# Patient Record
Sex: Female | Born: 1965 | Hispanic: Yes | Marital: Married | Smoking: Never smoker
Health system: Southern US, Community
[De-identification: ages and names within clinical notes are randomized; demographics above are authoritative.]

## PROBLEM LIST (undated history)

## (undated) HISTORY — PX: NECK SURGERY: SHX720

---

## 2016-05-03 ENCOUNTER — Ambulatory Visit (INDEPENDENT_AMBULATORY_CARE_PROVIDER_SITE_OTHER): Payer: Worker's Compensation | Admitting: Family Medicine

## 2016-05-03 ENCOUNTER — Encounter: Payer: Self-pay | Admitting: Family Medicine

## 2016-05-03 ENCOUNTER — Ambulatory Visit: Payer: Worker's Compensation

## 2016-05-03 VITALS — BP 118/74 | HR 75 | Temp 98.1°F | Resp 16 | Ht 60.0 in | Wt 151.0 lb

## 2016-05-03 DIAGNOSIS — S46912A Strain of unspecified muscle, fascia and tendon at shoulder and upper arm level, left arm, initial encounter: Secondary | ICD-10-CM

## 2016-05-03 DIAGNOSIS — M25512 Pain in left shoulder: Secondary | ICD-10-CM

## 2016-05-03 DIAGNOSIS — Z0289 Encounter for other administrative examinations: Secondary | ICD-10-CM

## 2016-05-03 MED ORDER — IBUPROFEN 600 MG PO TABS: 600.0000 mg | ORAL_TABLET | Freq: Three times a day (TID) | ORAL | 0 refills | Status: DC | PRN

## 2016-05-03 NOTE — Progress Notes (Signed)
Emma Lawrence is a 50 y.o. female who presents to Urgent Care today for shoulder pain for work-related injury which occurred 8/18:  1.  Shoulder pain:  She was sent from work. She works at [employer].  She works mostly Youth workermanual labor, working to [employer].  She has to carry racks of meat, put them in the oven, and then carry them to racks to cool off.  She the separates that into smaller pieces.    The pain started on Friday.  She was pulling grills filled with chicken from above into a plastic container.  This is a two person job, and the other person let her side go.  She felt a sudden pain in her shoulder when the other person dropped their side.  The motion was lifting the container from the ground.  Her arms were extended and she was trying to lift her side.  These containers are around 60 lbs.    She had some pain over the weekend and was taking Ibuprofen Saturday and Sunday with some relief of her pain.  She returned work on Monday, and Was not hurting Monday.  Pain returned on Tuesday. Hasn't taken Ibuprofen.  Pain has persisted since Tuesday  The pain radiates from above her shoulder to above the elbow and states it feels hot.    No deformity to shoulder.   ROS as above.    PMH reviewed. Patient is a nonsmoker.   No past medical history on file. No past surgical history on file.  Medications reviewed. No current outpatient prescriptions on file.   No current facility-administered medications for this visit.     Physical Exam:  BP 118/74 (BP Location: Right Arm, Patient Position: Sitting, Cuff Size: Normal)   Pulse 75   Temp 98.1 F (36.7 C)   Resp 16   Ht 5' (1.524 m)   Wt 151 lb (68.5 kg)   SpO2 98%   BMI 29.49 kg/m  Gen:  Alert, cooperative patient who appears stated age in no acute distress.  Vital signs reviewed. MSK. Left Shoulder Exam:  Appearance: Normal Pain on palpation: normal, some mild deltoid atrophy ROM: Abduction good, though pain beyond 90 degrees  FF 180  degrees  Int good  Ext diminished by pain  Strength: SS 5/5 Int 5/5  Ext 4/5 Neer's and empty can positive.  Otherwise shoulder testing negative.   [facility] reading (PRIMARY) by  Dr. Gwendolyn GrantWalden:  No signs of acute bony abnormality. No shoulder dislocation.   Assessment and Plan:  1.  Left shoulder pain: - think she has had muscle strain, likely rotator cuff strain.  Mild. - However, exacerbated by heavy lfiting - she can return to work with restrictions of not lifting anything above 10 lbs.   - Ibuprofen 600 mg, ice, rotational exercises for rehabilitation.  - FU with us on Tuesday for re-examination. She can get off work early that day.   [employer] interpreter Myrlene Brokerlejandra [phone] used for entire visit

## 2016-05-03 NOTE — Patient Instructions (Addendum)
You should come back and see us on Tuesday to be seen.    Take the Ibuprofen for pain relief every 6 - 8 hours as needed.  You can usKoreae ice as needed.  Do the shoulder rotations as we discussed.      IF you received an x-ray today, you will receive an invoice from [facility]. Please contact [facility] at [phone] with questions or concerns regarding your invoice.   IF you received labwork today, you will receive an invoice from [facility]. Please contact [facility] at [phone] with questions or concerns regarding your invoice.   Our billing staff will not be able to assist you with questions regarding bills from these companies.  You will be contacted with the lab results as soon as they are available. The fastest way to get your results is to activate your My Chart account. Instructions are located on the last page of this paperwork. If you have not heard from us regarding the results in 2 weeks, please contact this office.

## 2016-05-08 ENCOUNTER — Ambulatory Visit (INDEPENDENT_AMBULATORY_CARE_PROVIDER_SITE_OTHER): Payer: Worker's Compensation | Admitting: Physician Assistant

## 2016-05-08 VITALS — BP 116/78 | HR 71 | Temp 97.7°F | Resp 17 | Ht 60.0 in | Wt 149.0 lb

## 2016-05-08 DIAGNOSIS — M25512 Pain in left shoulder: Secondary | ICD-10-CM | POA: Diagnosis not present

## 2016-05-08 MED ORDER — NAPROXEN 500 MG PO TABS: 500.0000 mg | ORAL_TABLET | Freq: Two times a day (BID) | ORAL | 0 refills | Status: AC

## 2016-05-08 NOTE — Progress Notes (Signed)
    MRN: [id number] DOB: [birth year]  Subjective:   Emma Lawrence is a 50 y.o. female presenting for chief complaint of Follow-up (w/c injury)  Pt initially seen on 05/03/16. Since previous visit, she has avoided lifting heavy things at work. She still has associated burning pain in left shoulder extending down upper arm if she carries anything over the weight of a gallon of a milk or if she lifts left arm over her head. Has only tried ibuprofen and ice to affected area twice since her last visit and it provided relief. She does admit to doing the shoulder exercises every day since her last visit. Denies numbness, tingling, loss of sensation, and elbow/wrist pain.   [phone] Emma Lawrence was interpreter for visit.  Emma Lawrence's medications list, allergies, past medical history and past surgical history were reviewed and excluded from this note due to being a worker's comp case.  Objective:   Vitals: BP 116/78 (BP Location: Right Arm, Patient Position: Sitting, Cuff Size: Normal)   Pulse 71   Temp 97.7 F (36.5 C) (Oral)   Resp 17   Ht 5' (1.524 m)   Wt 149 lb (67.6 kg)   LMP  (LMP Unknown)   SpO2 100%   BMI 29.10 kg/m   Physical Exam  Constitutional: She is oriented to person, place, and time. She appears well-developed and well-nourished.  HENT:  Head: Normocephalic and atraumatic.  Eyes: Conjunctivae are normal.  Neck: Normal range of motion.  Pulmonary/Chest: Effort normal.  Musculoskeletal:       Right shoulder: Normal.       Left shoulder: She exhibits tenderness ( along palpation of medial deltoid ) and pain ( with internal rotation ). She exhibits no bony tenderness and no swelling.       Right elbow: Normal.      Left elbow: Normal.       Right wrist: Normal.       Left wrist: Normal.  Positive Neer's Sign Negative Drop Arm Sign  Pain with abduction of left arm     Neurological: She is alert and oriented to person, place, and time. She has normal strength. No sensory deficit.    Reflex Scores:      Tricep reflexes are 2+ on the right side and 2+ on the left side.      Bicep reflexes are 2+ on the right side and 2+ on the left side.      Brachioradialis reflexes are 2+ on the right side and 2+ on the left side. Skin: Skin is warm and dry.  Psychiatric: She has a normal mood and affect.  Vitals reviewed.  No results found for this or any previous visit (from the past 24 hour(s)).  Assessment and Plan :  1. Left shoulder pain -Likely muscle strain with possible radicular pain. Since pt has not tried consistent conservative therapy, will try this for one week and have pt follow up if no improvement -Ice to affected area 4-5 x day for 30 min at a time - naproxen (NAPROSYN) 500 MG tablet; Take 1 tablet (500 mg total) by mouth 2 (two) times daily with a meal.  Dispense: 30 tablet; Refill: 0 -Avoid overhead activities and lifting/pulling/pushing >10lbs. -Pt to return in one week for further evaluation. If no improvement in pain at this time, consider additional films of c-spine and/or referral to orthopedics or physical therapy.  Emma CoreBrittany Gina Costilla, PA-C  [facility] 05/08/2016 6:06 PM

## 2016-05-08 NOTE — Patient Instructions (Addendum)
Tome naproxen dos veces al da diariamente durante la semana siguiente para la inflamacin., Use hielo a la zona afectada 4-5 veces al da durante 30 minutos a la vez cada da para la inflamacin. No realice ninguna actividad area ni levante en el trabajo. Seguimiento en 7 das para su reexamen.   IF you received an x-ray today, you will receive an invoice from [facility]. Please contact [facility] at [phone] with questions or concerns regarding your invoice.   IF you received labwork today, you will receive an invoice from [facility]. Please contact [facility] at [phone] with questions or concerns regarding your invoice.   Our billing staff will not be able to assist you with questions regarding bills from these companies.  You will be contacted with the lab results as soon as they are available. The fastest way to get your results is to activate your My Chart account. Instructions are located on the last page of this paperwork. If you have not heard from us regarding the results in 2 weeks, please contact this office.

## 2016-08-28 ENCOUNTER — Other Ambulatory Visit: Payer: Self-pay | Admitting: Obstetrics and Gynecology

## 2016-08-28 DIAGNOSIS — Z1231 Encounter for screening mammogram for malignant neoplasm of breast: Secondary | ICD-10-CM

## 2016-09-27 ENCOUNTER — Ambulatory Visit (HOSPITAL_COMMUNITY): Payer: Self-pay

## 2016-11-01 ENCOUNTER — Encounter (HOSPITAL_COMMUNITY): Payer: Self-pay

## 2016-11-01 ENCOUNTER — Ambulatory Visit (HOSPITAL_COMMUNITY)
Admission: RE | Admit: 2016-11-01 | Discharge: 2016-11-01 | Disposition: A | Discharge status: Home / Self Care | Payer: Self-pay | Source: Ambulatory Visit | Attending: Obstetrics and Gynecology | Admitting: Obstetrics and Gynecology

## 2016-11-01 ENCOUNTER — Ambulatory Visit
Admission: RE | Admit: 2016-11-01 | Discharge: 2016-11-01 | Disposition: A | Discharge status: Home / Self Care | Payer: No Typology Code available for payment source | Source: Ambulatory Visit | Attending: Obstetrics and Gynecology | Admitting: Obstetrics and Gynecology

## 2016-11-01 VITALS — BP 108/62 | Temp 98.1°F | Ht 60.0 in | Wt 163.8 lb

## 2016-11-01 DIAGNOSIS — Z01419 Encounter for gynecological examination (general) (routine) without abnormal findings: Secondary | ICD-10-CM

## 2016-11-01 DIAGNOSIS — Z1231 Encounter for screening mammogram for malignant neoplasm of breast: Secondary | ICD-10-CM

## 2016-11-01 NOTE — Progress Notes (Signed)
No complaints today.   Pap Smear: Pap smear completed today. [place] Pap [place] was [place] years ago in [place] and normal per patient. Per patient has no history of an abnormal Pap smear. No Pap smear results are in EPIC.  Physical exam: Breasts Breasts symmetrical. No skin abnormalities bilateral breasts. No nipple retraction bilateral breasts. No nipple discharge bilateral breasts. No lymphadenopathy. No lumps palpated bilateral breasts. No complaints of pain or tenderness on exam. [facility]. Appointment scheduled for Thursday, November 01, 2016 at 1330.  Pelvic/Bimanual   Ext Genitalia No lesions, no swelling and no discharge observed on external genitalia.         Vagina Vagina pink and normal texture. No lesions or discharge observed in vagina.          Cervix Cervix is present. Cervix pink and of normal texture. Cervix friable. No discharge observed.     Uterus Uterus is present and palpable. Uterus in normal position and normal size.        Adnexae Bilateral ovaries present and palpable. No tenderness on palpation.          Rectovaginal No rectal exam completed today since patient had no rectal complaints. No skin abnormalities observed on exam.    Smoking History: Patient has never smoked.  Patient Navigation: Patient education provided. [facility] to services provided for [facility] through [facility]. Spanish interpreter provided.   Used Spanish interpreter [employer] Mora from [employer].

## 2016-11-01 NOTE — Progress Notes (Signed)
Pap smear completed

## 2016-11-01 NOTE — Patient Instructions (Signed)
Explained breast self awareness with Jeanett SchleinAlma Overfield. Let patient know [facility] will cover Pap smears and HPV typing every 5 years unless has a history of abnormal Pap smears. [facility] for [facility] mamm[facility]. Appointment scheduled for Thursday, November 01, 2016 at 1330. Let patient know will follow up with her within the next couple weeks with results of Pap smear by phone. Informed patient that the Breast Center will follow up with her within the next couple of weeks with results of mammogram by letter or phone. Jeanett SchleinAlma Jarnigan verbalized understanding.  Jayron Maqueda, Kathaleen Maserhristine Poll, RN 2:42 PM

## 2016-11-02 ENCOUNTER — Encounter (HOSPITAL_COMMUNITY): Payer: Self-pay | Admitting: *Deleted

## 2016-11-02 LAB — CYTOLOGY - PAP
Diagnosis: NEGATIVE
HPV (WINDOPATH): NOT DETECTED

## 2016-11-14 ENCOUNTER — Telehealth (HOSPITAL_COMMUNITY): Payer: Self-pay | Admitting: *Deleted

## 2016-11-14 NOTE — Telephone Encounter (Signed)
Patient returned called and advised patient of negative pap smear results. HPV was negative. Next pap smear due in five years. Patient voiced understanding. Used interpreter Delorise RoyalsJulie Sowell.

## 2016-11-14 NOTE — Telephone Encounter (Signed)
Telephoned patient at home number and left message to return call to [facility]. Used interpreter Julie Sowell.  

## 2018-01-19 IMAGING — DX DG SHOULDER 2+V*L*
3 series · 3 of 3 positions shown · non-contrast
Comparison: None.

CLINICAL DATA: Pain without trauma

EXAM:
LEFT SHOULDER - 2+ VIEW

[shoulder ap]
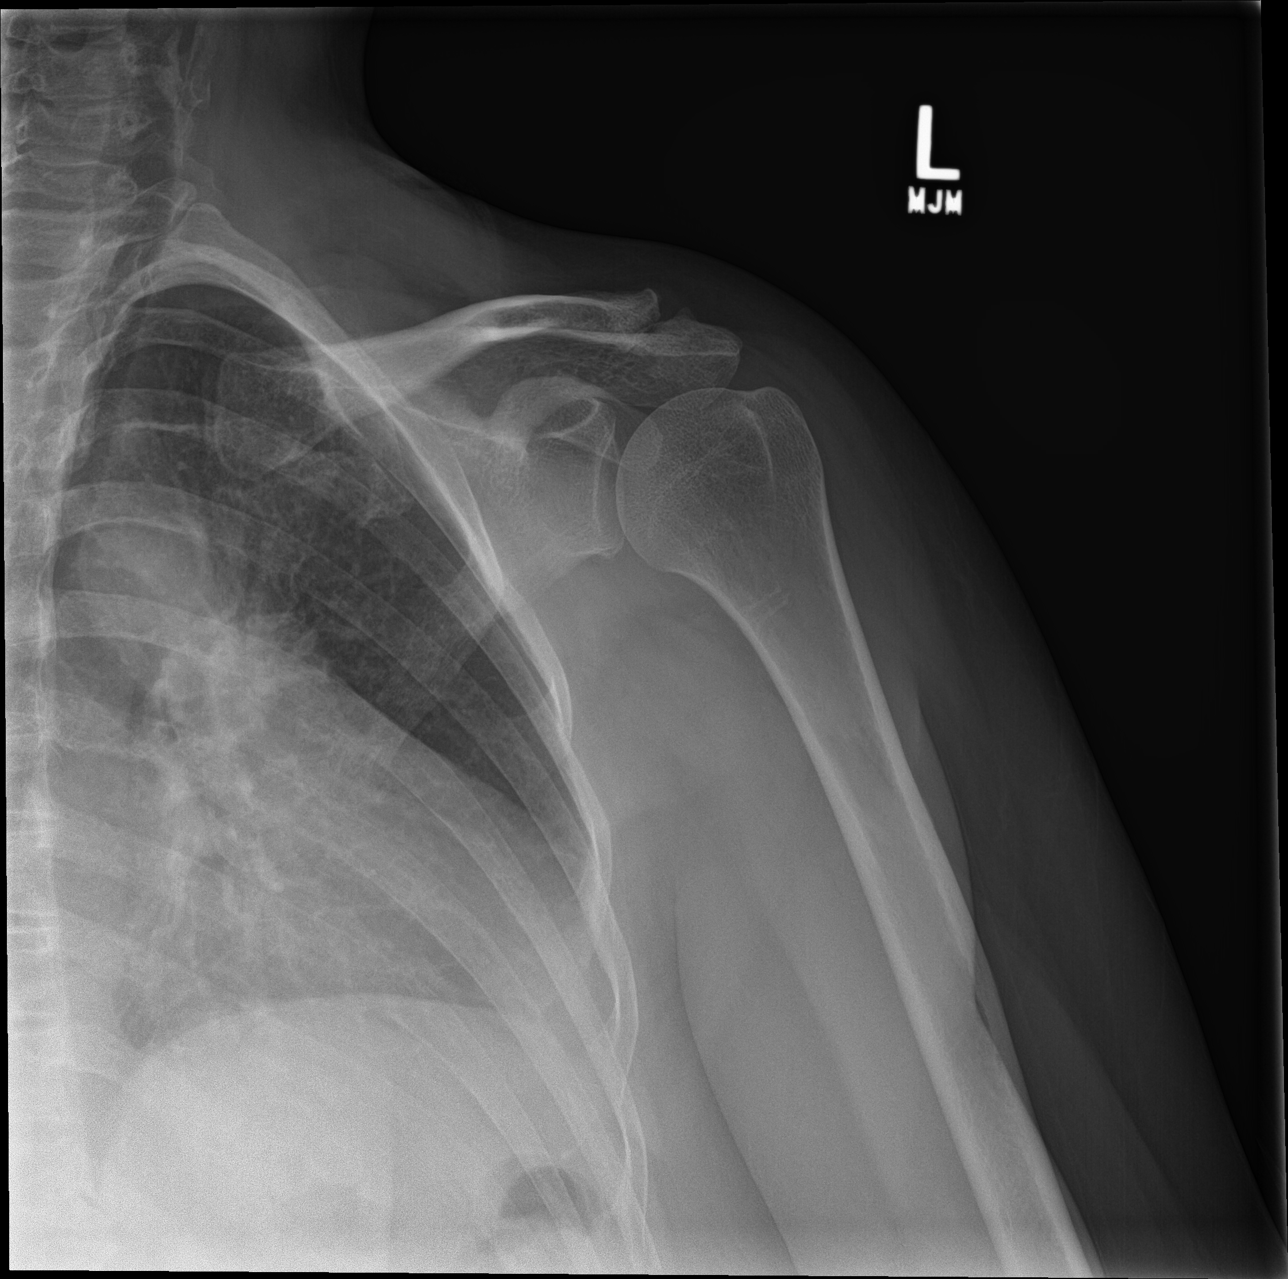

[shoulder y-view]
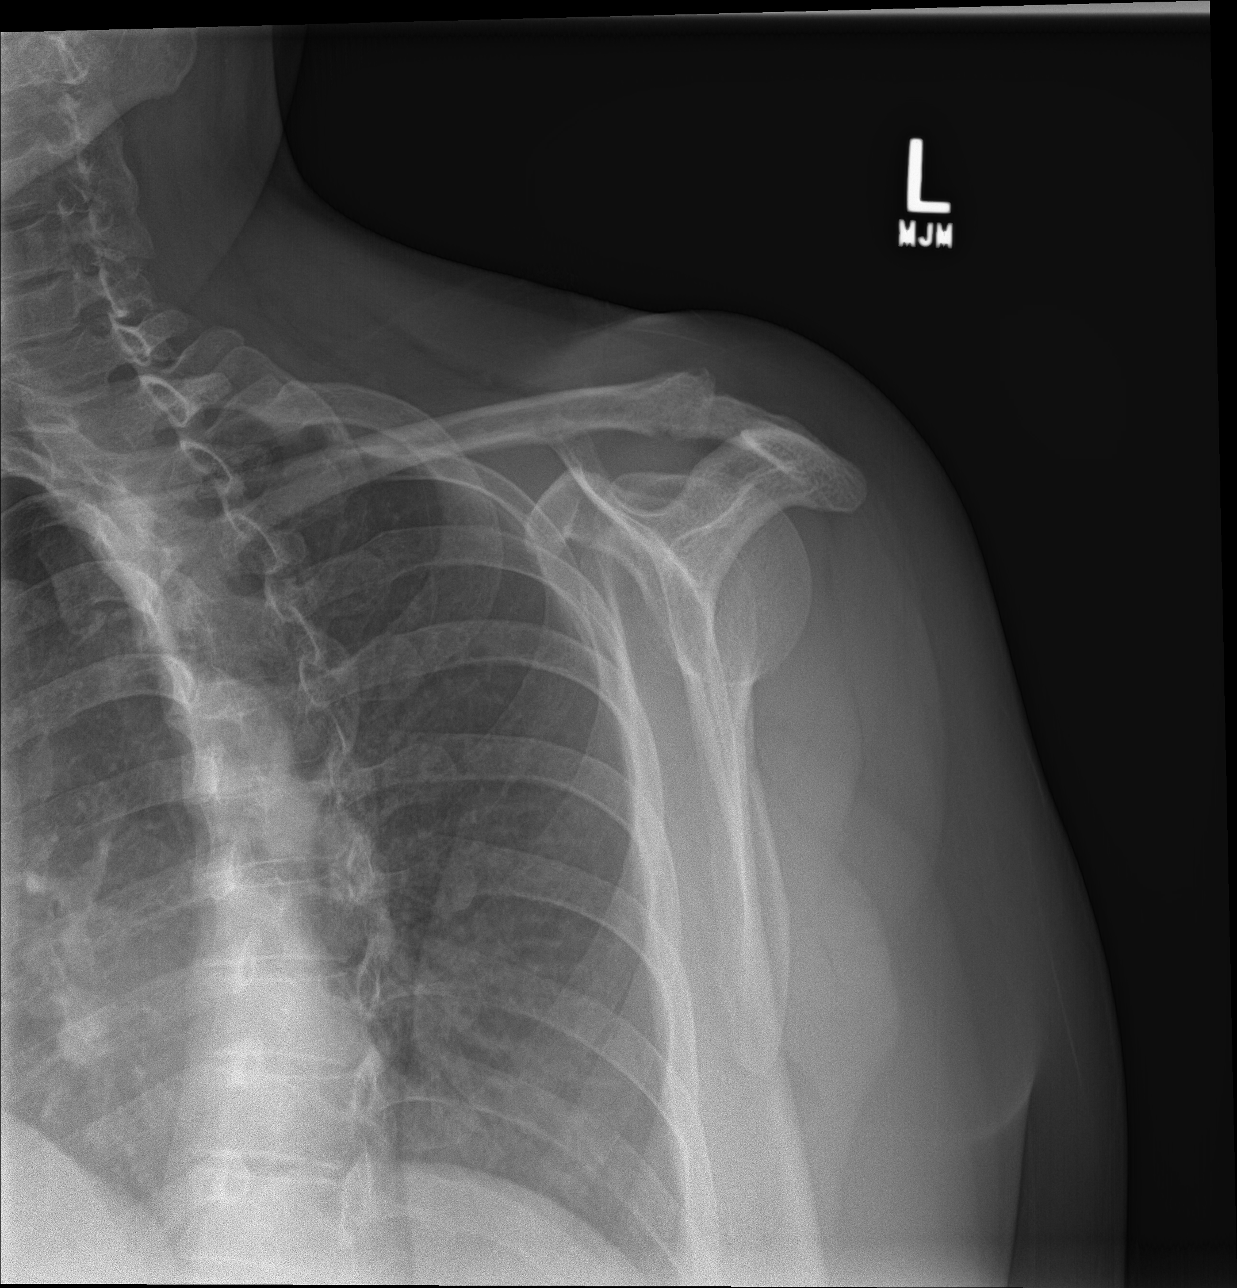

[shoulder axial]
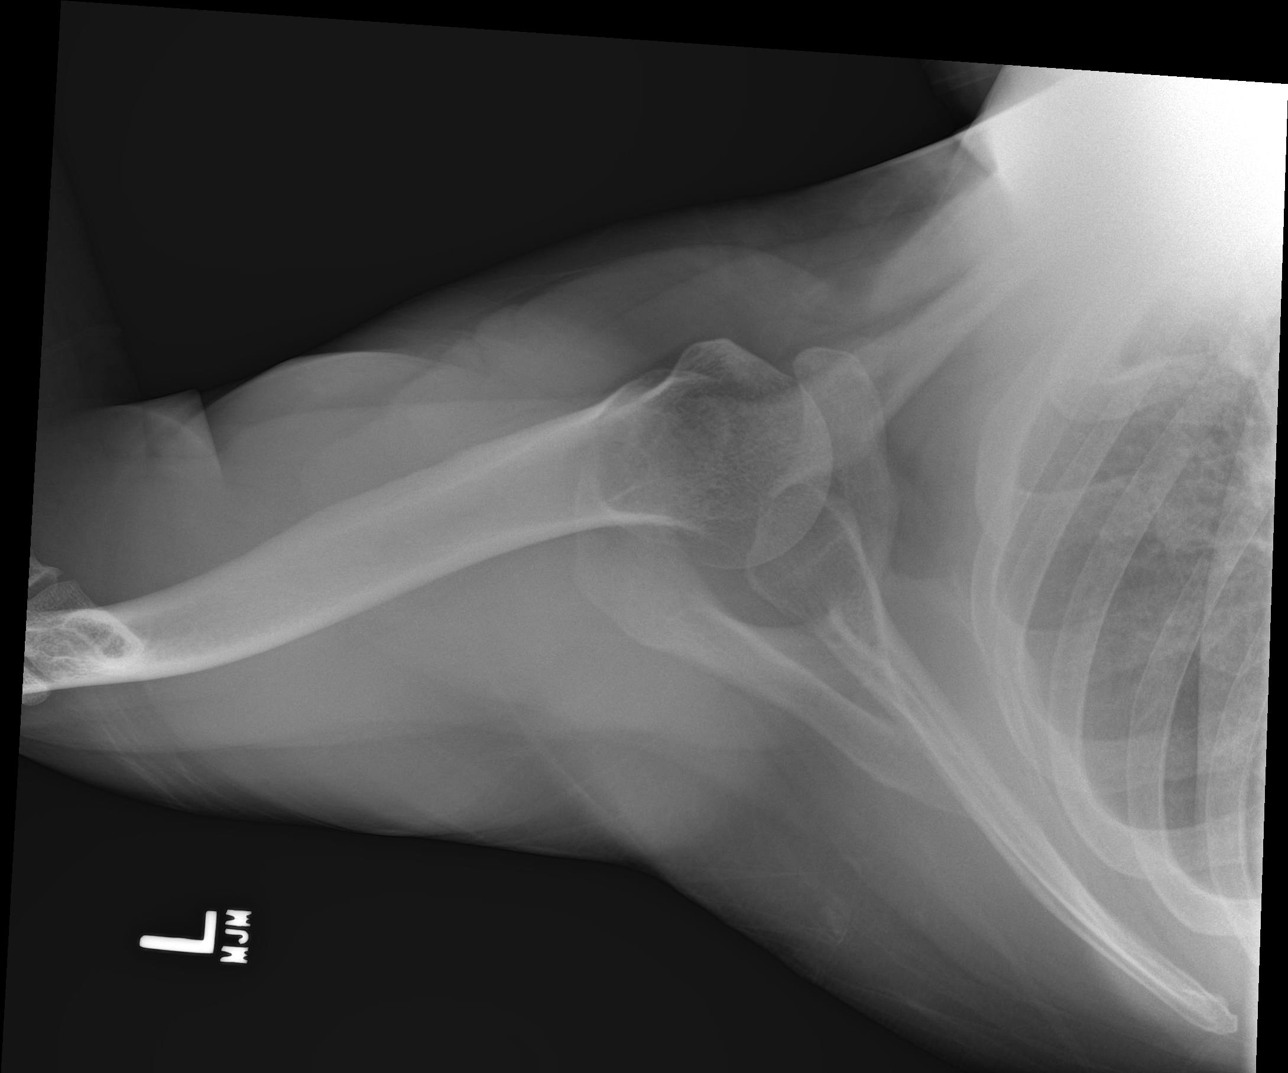

[3 of 3 positions shown; findings below may reference images not displayed]

FINDINGS: There is no evidence of fracture or dislocation. There is no
evidence of arthropathy or other focal bone abnormality. Soft
tissues are unremarkable.
IMPRESSION: Negative.
